# Patient Record
Sex: Male | Born: 1965 | Race: White | Marital: Single | Smoking: Former smoker
Health system: Southern US, Community
[De-identification: ages and names within clinical notes are randomized; demographics above are authoritative.]

## PROBLEM LIST (undated history)

## (undated) DIAGNOSIS — I4891 Unspecified atrial fibrillation: Secondary | ICD-10-CM

## (undated) DIAGNOSIS — M549 Dorsalgia, unspecified: Secondary | ICD-10-CM

## (undated) DIAGNOSIS — I10 Essential (primary) hypertension: Secondary | ICD-10-CM

## (undated) DIAGNOSIS — G8929 Other chronic pain: Secondary | ICD-10-CM

## (undated) HISTORY — PX: BACK SURGERY: SHX140

## (undated) HISTORY — PX: SHOULDER ACROMIOPLASTY: SHX6093

## (undated) HISTORY — PX: APPENDECTOMY: SHX54

---

## 2012-09-13 ENCOUNTER — Other Ambulatory Visit: Payer: Self-pay | Admitting: Neurological Surgery

## 2012-09-13 DIAGNOSIS — M549 Dorsalgia, unspecified: Secondary | ICD-10-CM

## 2012-09-18 ENCOUNTER — Ambulatory Visit
Admission: RE | Admit: 2012-09-18 | Discharge: 2012-09-18 | Disposition: A | Discharge status: Home / Self Care | Payer: BC Managed Care – PPO | Source: Ambulatory Visit | Attending: Neurological Surgery | Admitting: Neurological Surgery

## 2012-09-18 VITALS — BP 122/58 | HR 66 | Ht 74.0 in | Wt 185.0 lb

## 2012-09-18 DIAGNOSIS — M549 Dorsalgia, unspecified: Secondary | ICD-10-CM

## 2012-09-18 MED ORDER — IOHEXOL 180 MG/ML  SOLN
15.0000 mL | Freq: Once | INTRAMUSCULAR | Status: AC | PRN
  Administered 2012-09-18: 15 mL via INTRATHECAL

## 2012-09-18 MED ORDER — MEPERIDINE HCL 100 MG/ML IJ SOLN
75.0000 mg | Freq: Once | INTRAMUSCULAR | Status: AC
  Administered 2012-09-18: 75 mg via INTRAMUSCULAR

## 2012-09-18 MED ORDER — DIAZEPAM 5 MG PO TABS
10.0000 mg | ORAL_TABLET | Freq: Once | ORAL | Status: AC
  Administered 2012-09-18: 10 mg via ORAL

## 2012-09-18 MED ORDER — ONDANSETRON HCL 4 MG/2ML IJ SOLN
4.0000 mg | Freq: Once | INTRAMUSCULAR | Status: AC
  Administered 2012-09-18: 4 mg via INTRAMUSCULAR

## 2012-09-18 NOTE — Progress Notes (Signed)
Pt has been off of tramadol for the past 2 days.

## 2018-06-20 ENCOUNTER — Other Ambulatory Visit: Payer: Self-pay

## 2018-06-20 ENCOUNTER — Emergency Department (HOSPITAL_COMMUNITY): Payer: Medicare HMO

## 2018-06-20 ENCOUNTER — Encounter (HOSPITAL_COMMUNITY): Payer: Self-pay | Admitting: Emergency Medicine

## 2018-06-20 ENCOUNTER — Emergency Department (HOSPITAL_COMMUNITY)
Admission: EM | Admit: 2018-06-20 | Discharge: 2018-06-21 | Disposition: A | Discharge status: Home / Self Care | Payer: Medicare HMO | Attending: Emergency Medicine | Admitting: Emergency Medicine

## 2018-06-20 DIAGNOSIS — K760 Fatty (change of) liver, not elsewhere classified: Secondary | ICD-10-CM | POA: Insufficient documentation

## 2018-06-20 DIAGNOSIS — E860 Dehydration: Secondary | ICD-10-CM | POA: Diagnosis not present

## 2018-06-20 DIAGNOSIS — R109 Unspecified abdominal pain: Secondary | ICD-10-CM | POA: Insufficient documentation

## 2018-06-20 DIAGNOSIS — Z96611 Presence of right artificial shoulder joint: Secondary | ICD-10-CM | POA: Diagnosis not present

## 2018-06-20 DIAGNOSIS — E876 Hypokalemia: Secondary | ICD-10-CM | POA: Diagnosis not present

## 2018-06-20 DIAGNOSIS — I4891 Unspecified atrial fibrillation: Secondary | ICD-10-CM

## 2018-06-20 DIAGNOSIS — Z7982 Long term (current) use of aspirin: Secondary | ICD-10-CM | POA: Diagnosis not present

## 2018-06-20 DIAGNOSIS — I10 Essential (primary) hypertension: Secondary | ICD-10-CM | POA: Diagnosis not present

## 2018-06-20 DIAGNOSIS — Z87891 Personal history of nicotine dependence: Secondary | ICD-10-CM | POA: Diagnosis not present

## 2018-06-20 DIAGNOSIS — Z79899 Other long term (current) drug therapy: Secondary | ICD-10-CM | POA: Diagnosis not present

## 2018-06-20 HISTORY — DX: Other chronic pain: G89.29

## 2018-06-20 HISTORY — DX: Unspecified atrial fibrillation: I48.91

## 2018-06-20 HISTORY — DX: Dorsalgia, unspecified: M54.9

## 2018-06-20 HISTORY — DX: Essential (primary) hypertension: I10

## 2018-06-20 LAB — URINALYSIS, ROUTINE W REFLEX MICROSCOPIC
BILIRUBIN URINE: NEGATIVE
Glucose, UA: NEGATIVE mg/dL
Hgb urine dipstick: NEGATIVE
Ketones, ur: 20 mg/dL — AB
Leukocytes, UA: NEGATIVE
Nitrite: NEGATIVE
Protein, ur: NEGATIVE mg/dL
SPECIFIC GRAVITY, URINE: 1.039 — AB (ref 1.005–1.030)
pH: 8 (ref 5.0–8.0)

## 2018-06-20 LAB — COMPREHENSIVE METABOLIC PANEL
ALBUMIN: 5 g/dL (ref 3.5–5.0)
ALK PHOS: 76 U/L (ref 38–126)
ALT: 51 U/L — ABNORMAL HIGH (ref 0–44)
AST: 34 U/L (ref 15–41)
Anion gap: 14 (ref 5–15)
BILIRUBIN TOTAL: 1.1 mg/dL (ref 0.3–1.2)
BUN: 11 mg/dL (ref 6–20)
CALCIUM: 9.4 mg/dL (ref 8.9–10.3)
CO2: 23 mmol/L (ref 22–32)
Chloride: 97 mmol/L — ABNORMAL LOW (ref 98–111)
Creatinine, Ser: 0.78 mg/dL (ref 0.61–1.24)
GFR calc Af Amer: >60 mL/min (ref >60)
GLUCOSE: 111 mg/dL — AB (ref 70–99)
Potassium: 2.9 mmol/L — ABNORMAL LOW (ref 3.5–5.1)
Sodium: 134 mmol/L — ABNORMAL LOW (ref 135–145)
TOTAL PROTEIN: 8.5 g/dL — AB (ref 6.5–8.1)

## 2018-06-20 LAB — PROTIME-INR
INR: 0.99
Prothrombin Time: 13 seconds (ref 11.4–15.2)

## 2018-06-20 LAB — LACTIC ACID, PLASMA
Lactic Acid, Venous: 2.1 mmol/L (ref 0.5–1.9)
Lactic Acid, Venous: 1.7 mmol/L (ref 0.5–1.9)

## 2018-06-20 LAB — CBC WITH DIFFERENTIAL/PLATELET
Abs Immature Granulocytes: 0.02 10*3/uL (ref 0.00–0.07)
Basophils Absolute: 0 10*3/uL (ref 0.0–0.1)
Basophils Relative: 1 %
Eosinophils Absolute: 0 10*3/uL (ref 0.0–0.5)
Eosinophils Relative: 0 %
HEMATOCRIT: 48.3 % (ref 39.0–52.0)
HEMOGLOBIN: 16.6 g/dL (ref 13.0–17.0)
IMMATURE GRANULOCYTES: 0 %
LYMPHS ABS: 1.8 10*3/uL (ref 0.7–4.0)
LYMPHS PCT: 23 %
MCH: 28.7 pg (ref 26.0–34.0)
MCHC: 34.4 g/dL (ref 30.0–36.0)
MCV: 83.4 fL (ref 80.0–100.0)
MONO ABS: 0.5 10*3/uL (ref 0.1–1.0)
MONOS PCT: 7 %
NEUTROS PCT: 69 %
Neutro Abs: 5.3 10*3/uL (ref 1.7–7.7)
PLATELETS: 284 10*3/uL (ref 150–400)
RBC: 5.79 MIL/uL (ref 4.22–5.81)
RDW: 12.5 % (ref 11.5–15.5)
WBC: 7.7 10*3/uL (ref 4.0–10.5)
nRBC: 0 % (ref 0.0–0.2)

## 2018-06-20 LAB — INFLUENZA PANEL BY PCR (TYPE A & B)
INFLAPCR: NEGATIVE
INFLBPCR: NEGATIVE

## 2018-06-20 LAB — CK: CK TOTAL: 116 U/L (ref 49–397)

## 2018-06-20 LAB — LIPASE, BLOOD: LIPASE: 22 U/L (ref 11–51)

## 2018-06-20 LAB — TROPONIN I: Troponin I: <0.03 ng/mL (ref <0.03)

## 2018-06-20 MED ORDER — POTASSIUM CHLORIDE CRYS ER 20 MEQ PO TBCR
40.0000 meq | EXTENDED_RELEASE_TABLET | Freq: Once | ORAL | Status: AC
  Administered 2018-06-21: 40 meq via ORAL
  Filled 2018-06-20: qty 2

## 2018-06-20 MED ORDER — SODIUM CHLORIDE 0.9 % IV BOLUS
2000.0000 mL | Freq: Once | INTRAVENOUS | Status: AC
  Administered 2018-06-20: 2000 mL via INTRAVENOUS

## 2018-06-20 MED ORDER — MORPHINE SULFATE (PF) 4 MG/ML IV SOLN
4.0000 mg | Freq: Once | INTRAVENOUS | Status: AC
  Administered 2018-06-21: 4 mg via INTRAVENOUS
  Filled 2018-06-20: qty 1

## 2018-06-20 MED ORDER — ONDANSETRON HCL 4 MG/2ML IJ SOLN
4.0000 mg | Freq: Once | INTRAMUSCULAR | Status: AC
  Administered 2018-06-20: 4 mg via INTRAVENOUS
  Filled 2018-06-20: qty 2

## 2018-06-20 MED ORDER — ONDANSETRON 4 MG PO TBDP: 4.0000 mg | ORAL_TABLET | Freq: Three times a day (TID) | ORAL | 0 refills | Status: AC | PRN

## 2018-06-20 MED ORDER — POTASSIUM CHLORIDE ER 10 MEQ PO TBCR: 10.0000 meq | EXTENDED_RELEASE_TABLET | Freq: Every day | ORAL | 0 refills | Status: AC

## 2018-06-20 MED ORDER — IOPAMIDOL (ISOVUE-300) INJECTION 61%
100.0000 mL | Freq: Once | INTRAVENOUS | Status: AC | PRN
  Administered 2018-06-20: 100 mL via INTRAVENOUS

## 2018-06-20 MED ORDER — MORPHINE SULFATE (PF) 4 MG/ML IV SOLN
4.0000 mg | Freq: Once | INTRAVENOUS | Status: AC
  Administered 2018-06-20: 4 mg via INTRAVENOUS
  Filled 2018-06-20: qty 1

## 2018-06-20 NOTE — Discharge Instructions (Signed)
Your testing today does not show any specific abnormalities other than some dehydration.  We have given you IV fluids, we have given you potassium replacement for your slightly low potassium and you will need to continue to take this potassium for the next 7 days.  1 tablet/day.  I think that your morphine pump may be empty, it may be dysfunctional, this will need to be followed up tomorrow at your specialists office.  This would cause you to go through the symptoms of opiate withdrawal which are many of the symptoms that you are having.  You may return to the emergency department for severe or worsening symptoms however in the meantime please take Zofran for nausea, drink plenty of fluids.

## 2018-06-20 NOTE — ED Triage Notes (Signed)
Pt here by [facility], c/o body aches, fever, chills, n/v, denies diarrhea, decreased urination, per [facility] Afib (which is normal), bp 168/109, HR 118, CBG 132 since Sunday

## 2018-06-20 NOTE — ED Provider Notes (Signed)
[facility] EMERGENCY DEPARTMENT Provider Note   CSN: [id number] Arrival date & time: 06/20/18  1901     History   Chief Complaint Chief Complaint  Patient presents with  . Influenza    HPI Mike Nguyen is a 52 y.o. male.  HPI  The patient is a 52 year old male, he has a known history of atrial fibrillation, chronic back pain and hypertension, he does have a morphine pump implanted in his right abdomen which is a spinal injector.  He is on disability because of this.  He states that over the last several days he has had some rather persistent abdominal discomfort with persistent nausea vomiting, states he has not had a bowel movement since Saturday until this morning, he has not had any urine for 3 days but was finally able to urinate a small amount this morning.  He states he was extremely dark almost like [employer] in color.  He has not urinated the rest of the day.  He has not had anything else to eat or drink.  He has had fevers subjectively, body aches but denies any rashes.  Symptoms are persistent, gradually worsening and have now become severe prompting his visit.  He reports that when he walks he feels lightheaded and feels like he is going to pass out.  There has been no blood in stool.  Past Medical History:  Diagnosis Date  . Atrial fibrillation (HCC) 06/20/2018  . Chronic back pain   . Hypertension     There are no active problems to display for this patient.   Past Surgical History:  Procedure Laterality Date  . APPENDECTOMY    . BACK SURGERY    . SHOULDER ACROMIOPLASTY Left         Home Medications    Prior to Admission medications   Medication Sig Start Date End Date Taking? Authorizing Provider  albuterol (PROVENTIL HFA;VENTOLIN HFA) 108 (90 Base) MCG/ACT inhaler Inhale 1-2 puffs into the lungs every 6 (six) hours as needed for wheezing or shortness of breath.    Yes [provider]  amLODipine (NORVASC) 5 MG tablet Take 5 mg by mouth daily.  06/01/18  Yes [provider]  aspirin EC 81 MG tablet Take 81 mg by mouth every morning.   Yes [provider]  atorvastatin (LIPITOR) 20 MG tablet Take one tablet (20 mg dose) by mouth daily. 06/09/18  Yes [provider]  budesonide-formoterol (SYMBICORT) 160-4.5 MCG/ACT inhaler Inhale 2 puffs into the lungs 2 (two) times daily.    Yes [provider]  cyclobenzaprine (FLEXERIL) 10 MG tablet Take 10 mg by mouth 3 (three) times daily.  02/27/18  Yes [provider]  docusate sodium (COLACE) 250 MG capsule Take 250 mg by mouth 3 (three) times daily.   Yes [provider]  DULoxetine (CYMBALTA) 30 MG capsule Take 30 mg by mouth 2 (two) times daily.  05/06/18  Yes [provider]  gabapentin (NEURONTIN) 800 MG tablet Take 800 mg by mouth 3 (three) times daily.    Yes [provider]  hydrOXYzine (ATARAX/VISTARIL) 25 MG tablet Take 25 mg by mouth 3 (three) times daily. *Prescribed as needed for itching   Yes [provider]  metoprolol succinate (TOPROL-XL) 50 MG 24 hr tablet Take 50 mg by mouth daily. 05/29/18  Yes [provider]  MOVANTIK 25 MG TABS tablet Take 25 mg by mouth daily. 05/29/18  Yes [provider]  pantoprazole (PROTONIX) 40 MG tablet Take 40 mg  by mouth daily. 06/09/18  Yes [provider]  ranitidine (ZANTAC) 300 MG tablet Take 300 mg by mouth at bedtime.   Yes [provider]  Vitamin D, Ergocalciferol, (DRISDOL) 50000 units CAPS capsule Take 50,000 Units by mouth every Sunday.  06/01/18  Yes [provider]  ondansetron (ZOFRAN ODT) 4 MG disintegrating tablet Take 1 tablet (4 mg total) by mouth every 8 (eight) hours as needed for nausea. 06/20/18   Eber Hong, MD  potassium chloride (K-DUR) 10 MEQ tablet Take 1 tablet (10 mEq total) by mouth daily. 06/20/18   Eber Hong, MD    Family History History reviewed. No pertinent family history.  Social  History Social History   Tobacco Use  . Smoking status: Former Smoker    Packs/day: 1.50    Years: 28.00    Pack years: 42.00    Types: Cigarettes  . Smokeless tobacco: Never Used  . Tobacco comment: has got down to 1/3 pk per day  Substance Use Topics  . Alcohol use: Not Currently  . Drug use: Not Currently     Allergies   Hydrocodone and Penicillins   Review of Systems Review of Systems  All other systems reviewed and are negative.    Physical Exam Updated Vital Signs BP (!) 181/100   Pulse 74   Temp 98 F (36.7 C) (Oral)   Resp 14   Ht 1.854 m (6\' 1" )   Wt 102.1 kg   SpO2 94%   BMI 29.69 kg/m   Physical Exam  Constitutional: He appears well-developed and well-nourished. No distress.  HENT:  Head: Normocephalic and atraumatic.  Mouth/Throat: No oropharyngeal exudate.  Mucous membranes are dehydrated  Eyes: Pupils are equal, round, and reactive to light. Conjunctivae and EOM are normal. Right eye exhibits no discharge. Left eye exhibits no discharge. No scleral icterus.  Neck: Normal range of motion. Neck supple. No JVD present. No thyromegaly present.  Cardiovascular: Normal rate, regular rhythm, normal heart sounds and intact distal pulses. Exam reveals no gallop and no friction rub.  No murmur heard. Pulmonary/Chest: Effort normal and breath sounds normal. No respiratory distress. He has no wheezes. He has no rales.  Abdominal: Soft. Bowel sounds are normal. He exhibits no distension and no mass. There is tenderness ( Mild diffuse tenderness to palpation but much worse in the left lower quadrant and suprapubic area).  Musculoskeletal: Normal range of motion. He exhibits no edema or tenderness.  Lymphadenopathy:    He has no cervical adenopathy.  Neurological: He is alert. Coordination normal.  I watch the patient ambulate, initially he did fine but gradually became lightheaded and had to lean against a wall.  In the supine position his memory is intact,  speech is clear, strength is normal in all 4 extremities, he is able to sit up by himself in the bed without any truncal ataxia  Skin: Skin is warm. No rash noted. He is diaphoretic. No erythema.  Psychiatric: He has a normal mood and affect. His behavior is normal.  Nursing note and vitals reviewed.    ED Treatments / Results  Labs (all labs ordered are listed, but only abnormal results are displayed) Labs Reviewed  COMPREHENSIVE METABOLIC PANEL - Abnormal; Notable for the following components:      Result Value   Sodium 134 (*)    Potassium 2.9 (*)    Chloride 97 (*)    Glucose, Bld 111 (*)    Total Protein 8.5 (*)    ALT  51 (*)    All other components within normal limits  LACTIC ACID, PLASMA - Abnormal; Notable for the following components:   Lactic Acid, Venous 2.1 (*)    All other components within normal limits  URINALYSIS, ROUTINE W REFLEX MICROSCOPIC - Abnormal; Notable for the following components:   Specific Gravity, Urine 1.039 (*)    Ketones, ur 20 (*)    All other components within normal limits  URINE CULTURE  LIPASE, BLOOD  TROPONIN I  LACTIC ACID, PLASMA  CBC WITH DIFFERENTIAL/PLATELET  PROTIME-INR  INFLUENZA PANEL BY PCR (TYPE A & B)  CK    EKG EKG Interpretation  Date/Time:  Wednesday June 20 2018 21:50:34 EDT Ventricular Rate:  76 PR Interval:    QRS Duration: 109 QT Interval:  438 QTC Calculation: 493 R Axis:   71 Text Interpretation:  Sinus rhythm Borderline prolonged QT interval Baseline wander in lead(s) V4 No old tracing to compare Confirmed by Eber Hong ([id number]) on 06/20/2018 10:57:21 PM   Radiology Ct Abdomen Pelvis W Contrast  Result Date: 06/20/2018 CLINICAL DATA:  52 year old male with abdominal pain. Concern for diverticulitis. EXAM: CT ABDOMEN AND PELVIS WITH CONTRAST TECHNIQUE: Multidetector CT imaging of the abdomen and pelvis was performed using the standard protocol following bolus administration of intravenous contrast.  CONTRAST:  ISOVUE-300 IOPAMIDOL (ISOVUE-300) INJECTION 61% COMPARISON:  None. FINDINGS: Lower chest: Minimal bibasilar dependent atelectatic changes. The visualized lung bases are otherwise clear. There is coronary vascular calcification primarily involving the RCA. No intra-abdominal free air or free fluid. Hepatobiliary: Diffuse fatty infiltration of the liver. No intrahepatic biliary ductal dilatation. The gallbladder is unremarkable. Pancreas: Unremarkable. No pancreatic ductal dilatation or surrounding inflammatory changes. Spleen: Normal in size without focal abnormality. Adrenals/Urinary Tract: The adrenal glands are unremarkable. The kidneys, visualized ureters, and urinary bladder appear unremarkable as well. Subcentimeter left renal hypodense lesion is too small to characterize. Stomach/Bowel: There is no bowel obstruction or active inflammation. Vascular/Lymphatic: There is moderate aortoiliac atherosclerotic disease. No portal venous gas. There is no adenopathy. Reproductive: The prostate and seminal vesicles are grossly unremarkable. No pelvic mass. Other: There is a small fat containing umbilical hernia. Midline vertical anterior pelvic wall incisional scar. A spinal stimulator with battery pack in the subcutaneous soft tissues of the right anterior pelvic wall. Musculoskeletal: Degenerative changes of the spine. There is grade 2 L5-S1 anterolisthesis. L4-S1 posterior fusion hardware. No acute osseous pathology. IMPRESSION: 1. No acute intra-abdominal or pelvic pathology. 2. Fatty liver. Electronically Signed   By: Elgie Collard M.D.   On: 06/20/2018 22:53   Dg Abd Acute W/chest  Result Date: 06/20/2018 CLINICAL DATA:  Nausea, vomiting and diarrhea. EXAM: DG ABDOMEN ACUTE W/ 1V CHEST COMPARISON:  None. FINDINGS: There is no evidence of dilated bowel loops or free intraperitoneal air. No radiopaque calculi or other significant radiographic abnormality is seen. Heart size and mediastinal  contours are within normal limits. Both lungs are clear. Lower spinal hardware and generator projecting over the right lower quadrant. The IMPRESSION: Negative abdominal radiographs.  No acute cardiopulmonary disease. Electronically Signed   By: Deatra Robinson M.D.   On: 06/20/2018 21:13    Procedures Procedures (including critical care time)  Medications Ordered in ED Medications  potassium chloride SA (K-DUR,KLOR-CON) CR tablet 40 mEq (has no administration in time range)  morphine 4 MG/ML injection 4 mg (has no administration in time range)  sodium chloride 0.9 % bolus 2,000 mL (0 mLs Intravenous Stopped 06/20/18 2319)  ondansetron (ZOFRAN) injection  4 mg (4 mg Intravenous Given 06/20/18 2155)  morphine 4 MG/ML injection 4 mg (4 mg Intravenous Given 06/20/18 2200)  iopamidol (ISOVUE-300) 61 % injection 100 mL (100 mLs Intravenous Contrast Given 06/20/18 2230)     Initial Impression / Assessment and Plan / ED Course  I have reviewed the triage vital signs and the nursing notes.  Pertinent labs & imaging results that were available during my care of the patient were reviewed by me and considered in my medical decision making (see chart for details).  Clinical Course as of Oct 31 0000  Wed Jun 20, 2018  2252 Potassium level is 2.9 otherwise the electrolytes and renal function are normal.  Lipase is 22, CBC shows no leukocytosis of any concern, the patient has a lactic acid of 2.1, he does have some hypertension, the urinalysis has yet to be collected at this point   [BM]  2331 Potassium replaced PO - morphine helped with Sx almost immediately - CT negative - UA pending   [BM]  2349 The urinalysis does not fact reveal a specific gravity which is high with a couple of ketones, the patient was given IV fluids and potassium replacement.  No other findings of acute infection.  I do suspect that he has some issue with his morphine pump, he will need to follow-up very closely for this.   [BM]     Clinical Course User Index [BM] Eber Hong, MD    The patient's x-ray revealed no signs of acute findings on his acute abdomen.  His labs have been drawn, he does have some urine in his bladder, at least 200 cc based on my bedside ultrasound.  He does not feel like he needs to urinate.  I suspect that he is severely dehydrated and may be in rhabdomyolysis given his myalgias and dark-colored urine.  We will check for several different etiologies, would also consider that this could be a type of morphine withdrawal except he does not have the diarrhea.  He states that his morphine injector had been refilled recently and should be good through February according to his report.  The patient has been informed of his results, potassium prescribed for home as well as Zofran, lactic acid is 1.7, this is better than the initial 2.1 and again no leukocytosis or other findings of infection.  Influenza testing is negative, cardiac testing is negative, the patient is stable for discharge.  I recommended that he follow-up closely for possible morphine pump dysfunction.  Final Clinical Impressions(s) / ED Diagnoses   Final diagnoses:  Dehydration  Hypokalemia    ED Discharge Orders         Ordered    potassium chloride (K-DUR) 10 MEQ tablet  Daily     06/20/18 2358    ondansetron (ZOFRAN ODT) 4 MG disintegrating tablet  Every 8 hours PRN     06/20/18 2358           Eber Hong, MD 06/21/18 0000

## 2018-06-20 NOTE — ED Notes (Signed)
CRITICAL VALUE ALERT  Critical Value:  2.1 Lactic Acid  Date & Time Notied:  06/20/2018 2212  Provider Notified: Dr. Hyacinth Meeker  Orders Received/Actions taken: See chart

## 2018-06-21 MED ORDER — ONDANSETRON HCL 4 MG/2ML IJ SOLN
4.0000 mg | Freq: Once | INTRAMUSCULAR | Status: AC
  Administered 2018-06-21: 4 mg via INTRAVENOUS

## 2018-06-21 MED ORDER — ONDANSETRON HCL 4 MG/2ML IJ SOLN
INTRAMUSCULAR | Status: AC
  Filled 2018-06-21: qty 2

## 2018-06-21 NOTE — ED Notes (Signed)
Pt vomited small amount 

## 2018-06-21 NOTE — ED Notes (Signed)
edp aware pt stated he will just go to the lobby and if he starts throwing up he can just come back in.

## 2018-06-22 LAB — URINE CULTURE: Culture: NO GROWTH

## 2019-07-10 IMAGING — CT CT ABD-PELV W/ CM
2 of 4 series · 16 of 46 positions shown, 18 images · IV contrast (Isovue)
Comparison: None.

CLINICAL DATA: 52-year-old male with abdominal pain. Concern for
diverticulitis.

EXAM:
CT ABDOMEN AND PELVIS WITH CONTRAST
TECHNIQUE: Multidetector CT imaging of the abdomen and pelvis was performed
using the standard protocol following bolus administration of
intravenous contrast.
CONTRAST:  100mL 3I37RY-HVV IOPAMIDOL (3I37RY-HVV) INJECTION 61%

[Series 2: axial st · axial · 0.89mm/px · z∈[+970,+1420]mm · 13 of 100 slices shown, 15 images]
[im 5/100  soft-tissue]
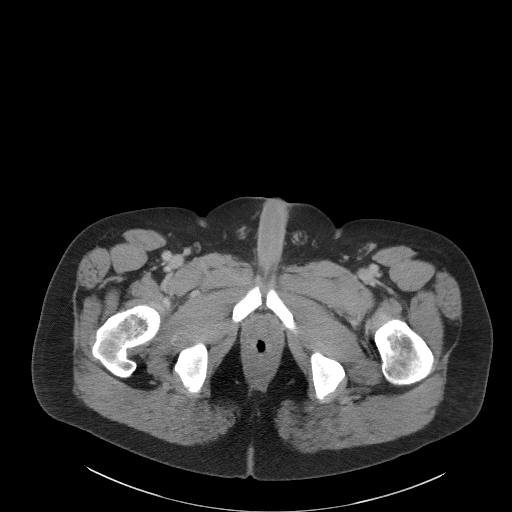
[im 5/100  bone]
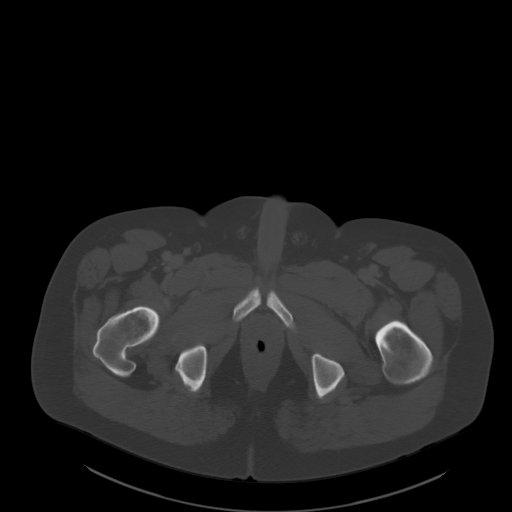
[im 15/100  soft-tissue]
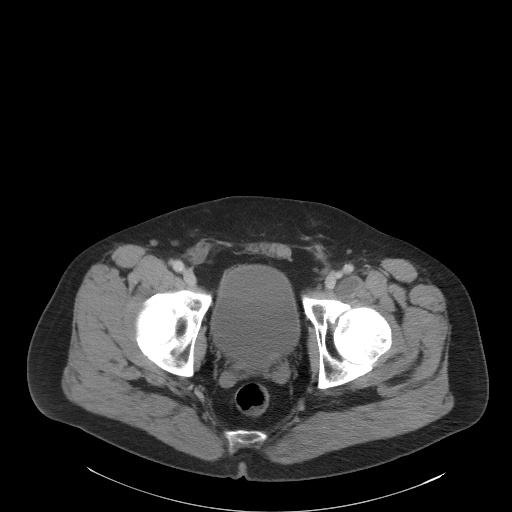
[im 20/100  soft-tissue]
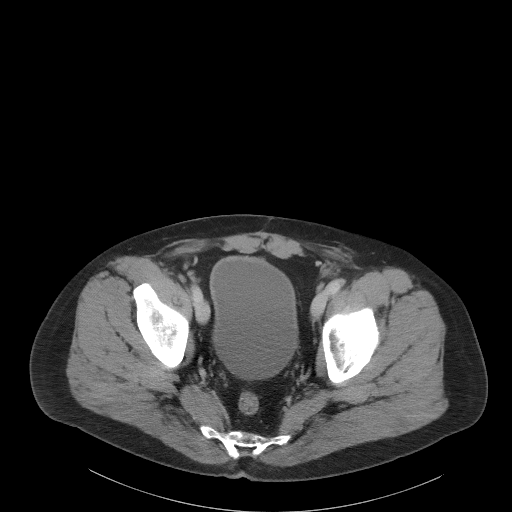
[im 30/100  soft-tissue]
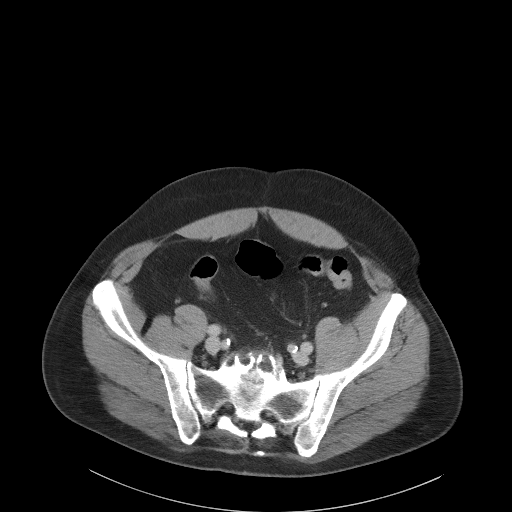
[im 35/100  soft-tissue]
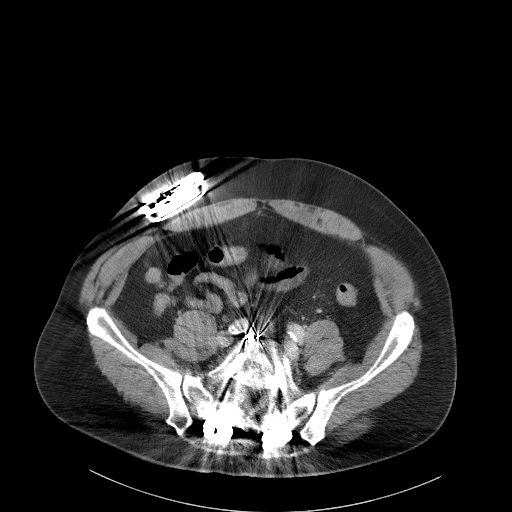
[im 45/100  soft-tissue]
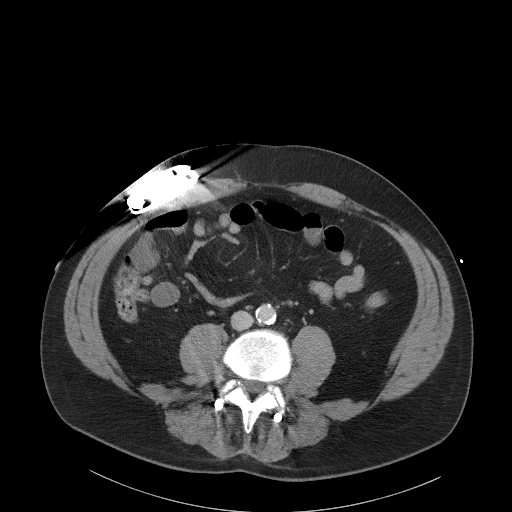
[im 50/100  soft-tissue]
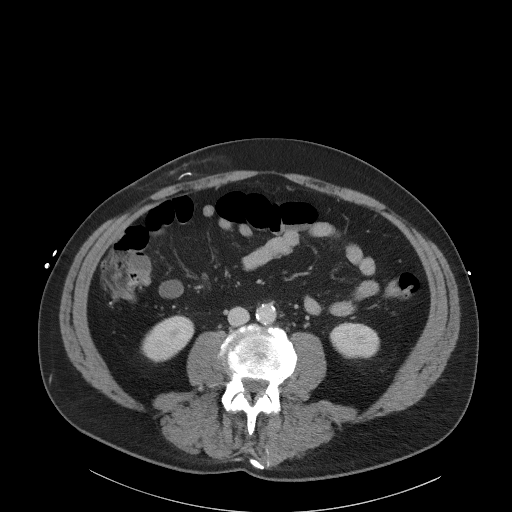
[im 55/100  soft-tissue]
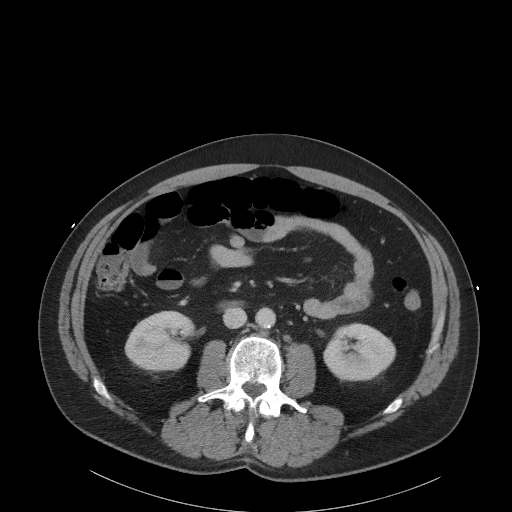
[im 65/100  soft-tissue]
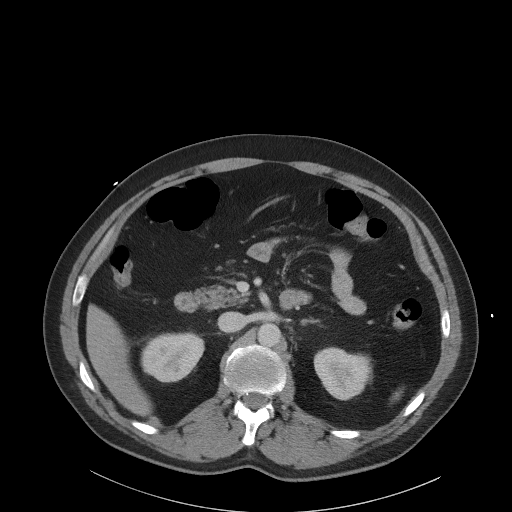
[im 65/100  bone]
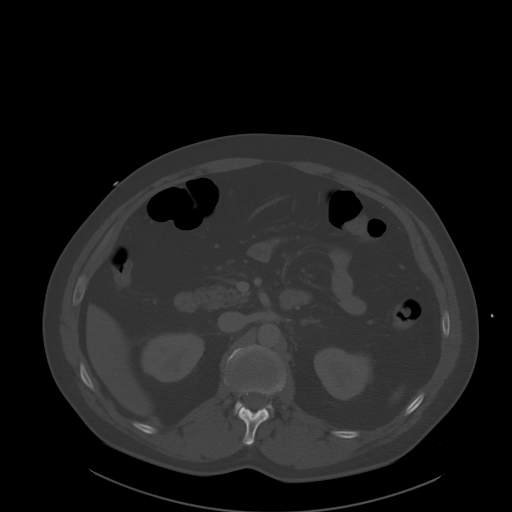
[im 70/100  soft-tissue]
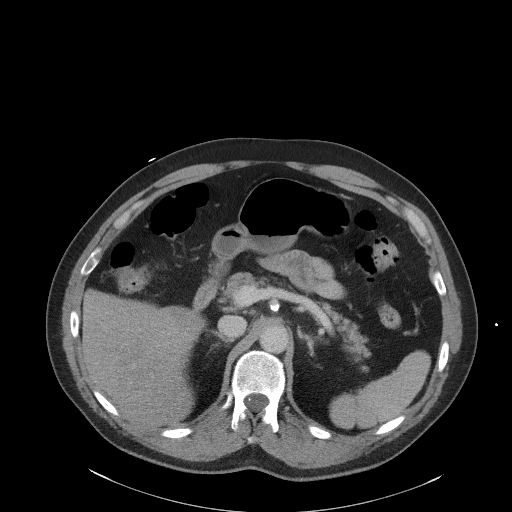
[im 80/100  soft-tissue]
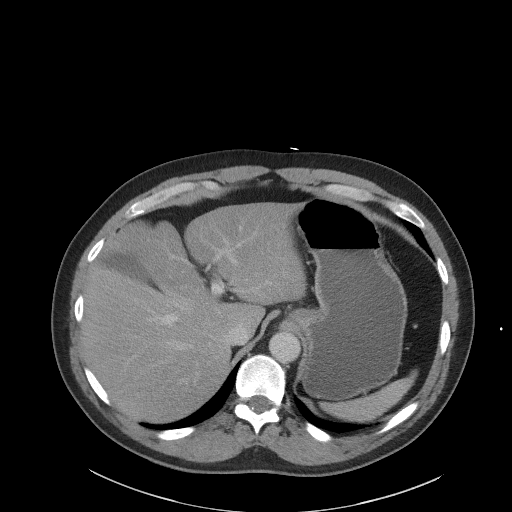
[im 85/100  soft-tissue]
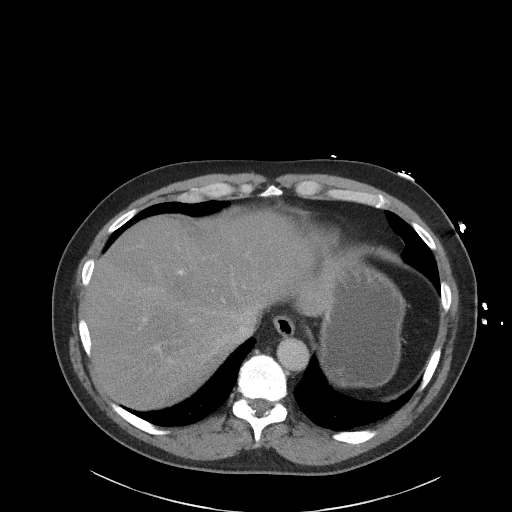
[im 95/100  soft-tissue]
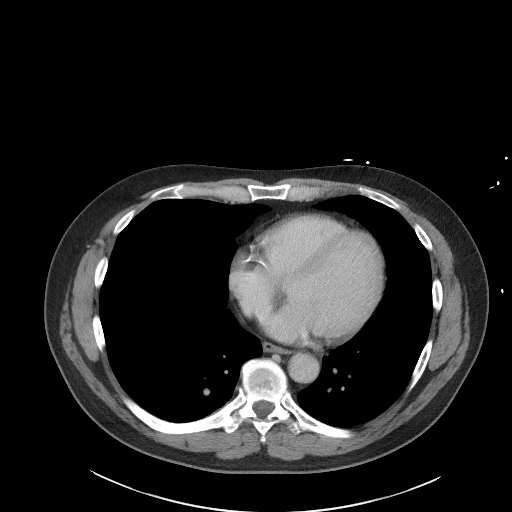

[Series 5: coronal st · coronal · 0.86mm/px · 3 of 106 slices shown]
[im 36/106  soft-tissue]
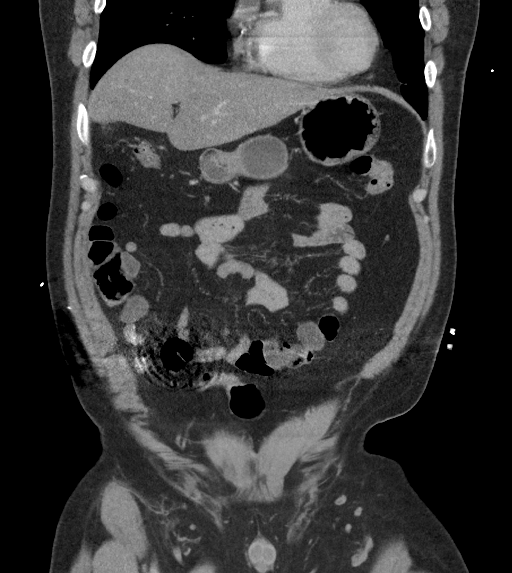
[im 47/106  soft-tissue]
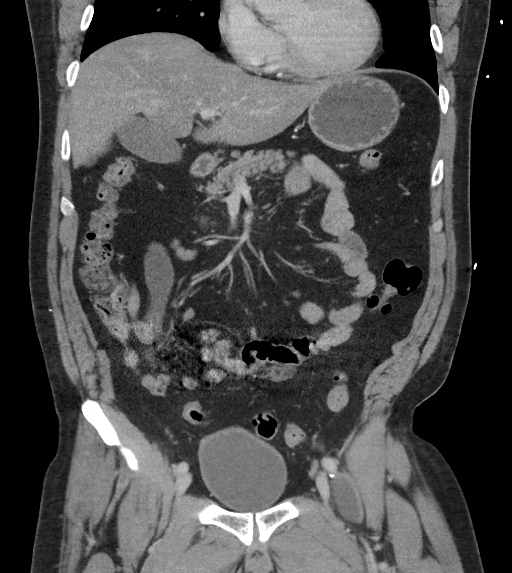
[im 59/106  soft-tissue]
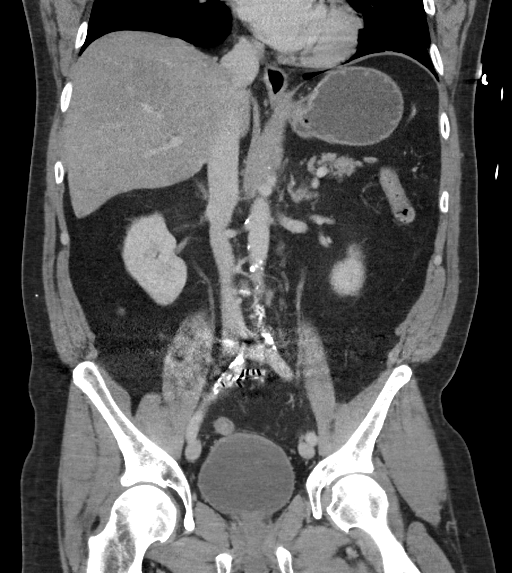

[16 of 46 positions shown; findings below may reference images not displayed]

FINDINGS: Lower chest: Minimal bibasilar dependent atelectatic changes. The
visualized lung bases are otherwise clear. There is coronary
vascular calcification primarily involving the RCA.

No intra-abdominal free air or free fluid.

Hepatobiliary: Diffuse fatty infiltration of the liver. No
intrahepatic biliary ductal dilatation. The gallbladder is
unremarkable.

Pancreas: Unremarkable. No pancreatic ductal dilatation or
surrounding inflammatory changes.

Spleen: Normal in size without focal abnormality.

Adrenals/Urinary Tract: The adrenal glands are unremarkable. The
kidneys, visualized ureters, and urinary bladder appear unremarkable
as well. Subcentimeter left renal hypodense lesion is too small to
characterize.

Stomach/Bowel: There is no bowel obstruction or active inflammation.

Vascular/Lymphatic: There is moderate aortoiliac atherosclerotic
disease. No portal venous gas. There is no adenopathy.

Reproductive: The prostate and seminal vesicles are grossly
unremarkable. No pelvic mass.

Other: There is a small fat containing umbilical hernia. Midline
vertical anterior pelvic wall incisional scar. A spinal stimulator
with battery pack in the subcutaneous soft tissues of the right
anterior pelvic wall.

Musculoskeletal: Degenerative changes of the spine. There is grade 2
L5-S1 anterolisthesis. L4-S1 posterior fusion hardware. No acute
osseous pathology.
IMPRESSION: 1. No acute intra-abdominal or pelvic pathology.
2. Fatty liver.
